# Patient Record
Sex: Female | Born: 1970 | Race: Asian | Hispanic: No | Marital: Married | Smoking: Never smoker
Health system: Southern US, Community
[De-identification: ages and names within clinical notes are randomized; demographics above are authoritative.]

---

## 2008-02-22 ENCOUNTER — Encounter (INDEPENDENT_AMBULATORY_CARE_PROVIDER_SITE_OTHER): Payer: Self-pay | Admitting: Obstetrics and Gynecology

## 2008-02-22 ENCOUNTER — Ambulatory Visit (HOSPITAL_COMMUNITY): Admission: RE | Admit: 2008-02-22 | Discharge: 2008-02-22 | Payer: Self-pay | Admitting: Obstetrics and Gynecology

## 2008-05-06 ENCOUNTER — Ambulatory Visit (HOSPITAL_COMMUNITY): Admission: RE | Admit: 2008-05-06 | Discharge: 2008-05-06 | Payer: Self-pay | Admitting: Obstetrics and Gynecology

## 2009-02-06 ENCOUNTER — Inpatient Hospital Stay (HOSPITAL_COMMUNITY): Admission: AD | Admit: 2009-02-06 | Discharge: 2009-02-08 | Payer: Self-pay | Admitting: Obstetrics and Gynecology

## 2010-08-09 IMAGING — RF DG HYSTEROGRAM
3 series · 3 of 3 positions shown · IV contrast (omnipaque)
Comparison: none

CLINICAL DATA: Infertility; history of three miscarriages

HYSTEROSALPINGOGRAM
TECHNIQUE: Following cleansing of the cervix and vagina with
Betadine solution, a hysterosalpingogram was performed using a 5-
French hysterosalpingogram catheter and Omnipaque 300 contrast. The
patient tolerated the examination without difficulty.

[Series 1: run · 1 of 1 slices shown (1 of 3)]
[im 1/1]
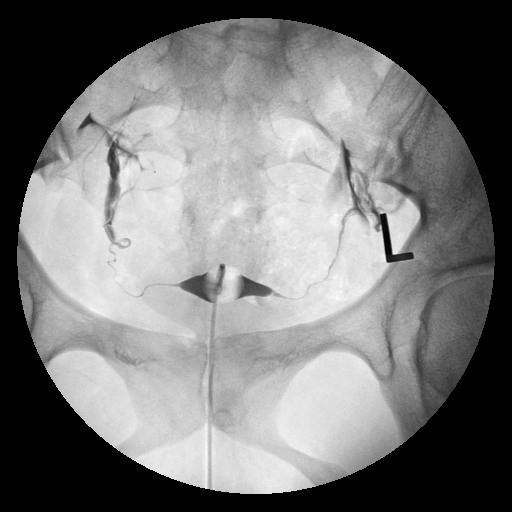

[Series 2: run · 1 of 1 slices shown (2 of 3)]
[im 1/1]
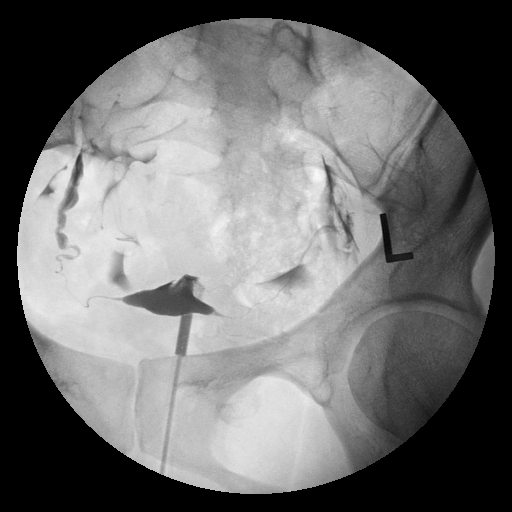

[Series 3: run · 1 of 1 slices shown (3 of 3)]
[im 1/1]
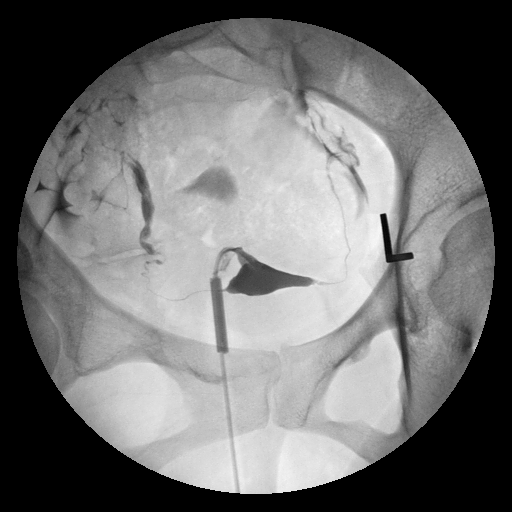

[3 of 3 positions shown; findings below may reference images not displayed]

FINDINGS: The uterine cavity has a normal appearance with no
evidence of fixed filling defects.  Both fallopian tubes promptly
fill and have a normal appearance.  There is free spill of contrast
into both adnexal regions.
IMPRESSION: Normal HSG.

## 2010-08-13 LAB — CBC
HCT: 29.4 % — ABNORMAL LOW (ref 36.0–46.0)
Hemoglobin: 9.9 g/dL — ABNORMAL LOW (ref 12.0–15.0)
MCV: 95.5 fL (ref 78.0–100.0)
Platelets: 249 10*3/uL (ref 150–400)
RBC: 3.08 MIL/uL — ABNORMAL LOW (ref 3.87–5.11)
WBC: 17.5 10*3/uL — ABNORMAL HIGH (ref 4.0–10.5)

## 2010-08-14 LAB — CBC
HCT: 36.6 % (ref 36.0–46.0)
Hemoglobin: 12.3 g/dL (ref 12.0–15.0)
MCV: 94.5 fL (ref 78.0–100.0)
RDW: 13.9 % (ref 11.5–15.5)

## 2010-09-22 NOTE — Op Note (Signed)
NAMEJIRAH, Betty [name]                    ACCOUNT NO.:  [id number]   MEDICAL RECORD NO.:  [id number]          PATIENT TYPE:  AMB   LOCATION:  SDC                           FACILITY:  [facility]   PHYSICIAN:  Lenoard Aden, M.D.DATE OF BIRTH:  [birth year]   DATE OF PROCEDURE:  DATE OF DISCHARGE:                               OPERATIVE REPORT   PREOPERATIVE DIAGNOSES:  1. Missed abortion.  2. 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   POSTOPERATIVE DIAGNOSES:  1. Missed abortion.  2. 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   PROCEDURE:  Suction dilatation and evacuation.   SURGEON:  Lenoard Aden, MD   ANESTHESIA:  MAC paracervical.   ESTIMATED BLOOD LOSS:  Less than 50 mL.   COMPLICATIONS:  None.   DRAINS:  None.   COUNTS:  Correct.   The patient recovering in good condition.   Tissue for pathology and for chromosomal analysis.   BRIEF OPERATIVE NOTE:  After being appraised the risks of anesthesia,  infection, bleeding, injury to abdominal organs, need for repair,  delayed versus immediate complications include bowel and bladder injury  with uterine perforation, possible need for repair.  The patient was  brought to the operating room.  She was administered IV sedation without  difficulty, prepped and draped in usual sterile fashion and catheterized  till the bladder was empty.  Paracervical block placed in standard  fashion using a dilute Xylocaine solution.  Cervix was easily dilated up  to a #25 Pratt dilator and 8-mm suction curette placed.  Products of  conception aspirated and noted.  Repeat suction and  curettage in a four-quadrant method reveals the cavity to be empty.  Good hemostasis was noted.  Tissue collected for chromosomal analysis.  All instruments were removed from the vagina.  The patient is awakened  and transferred to recovery in good condition.      Lenoard Aden, M.D.  Electronically  Signed     RJT/MEDQ  D:  02/22/2008  T:  02/23/2008  Job:  [id number]

## 2010-09-26 ENCOUNTER — Inpatient Hospital Stay (HOSPITAL_COMMUNITY)
Admission: EM | Admit: 2010-09-26 | Discharge: 2010-09-28 | DRG: 373 | Disposition: A | Discharge status: Home / Self Care | Payer: BC Managed Care – PPO | Source: Ambulatory Visit | Attending: Obstetrics & Gynecology | Admitting: Obstetrics & Gynecology

## 2010-09-26 DIAGNOSIS — O09529 Supervision of elderly multigravida, unspecified trimester: Secondary | ICD-10-CM | POA: Diagnosis present

## 2010-09-27 LAB — RPR: RPR Ser Ql: NONREACTIVE

## 2010-09-27 LAB — CBC
MCH: 30.6 pg (ref 26.0–34.0)
MCHC: 33.6 g/dL (ref 30.0–36.0)
MCV: 90.9 fL (ref 78.0–100.0)
Platelets: 256 10*3/uL (ref 150–400)
RBC: 3.86 MIL/uL — ABNORMAL LOW (ref 3.87–5.11)

## 2010-09-29 ENCOUNTER — Inpatient Hospital Stay (HOSPITAL_COMMUNITY): Admission: AD | Admit: 2010-09-29 | Payer: Self-pay | Source: Ambulatory Visit | Admitting: Obstetrics and Gynecology

## 2010-10-09 NOTE — H&P (Signed)
  [name], [name]                    ACCOUNT NO.:  [id number]  MEDICAL RECORD NO.:  [id number]        PATIENT TYPE:  WINP  LOCATION:  106                           FACILITY:  [facility]  PHYSICIAN:  Lenoard Aden, M.D.DATE OF BIRTH:  18-Jan-1971  DATE OF ADMISSION:  09/26/2010 DATE OF DISCHARGE:                             HISTORY & PHYSICAL   CHIEF COMPLAINT:  History of advanced dilatation, precipitous labor for induction.  HISTORY OF PRESENT ILLNESS:  A 40 year old Asian female, G5, P1, with a history of advanced dilatation and history of precipitous labor with previous vaginal delivery who presents for controlled effort at induction.  ALLERGIES:  She has no known drug allergies, no known LATEX allergies.  MEDICATIONS:  Prenatal vitamins.  FAMILY HISTORY:  Diabetes, heart disease, respiratory failure, hypertension, with the history of vaginal delivery x1, miscarriage x4, with D and C x2.  PHYSICAL EXAMINATION:  GENERAL:  She is a well-developed, well-nourished Asian female, in no acute distress. HEENT:  Normal. LUNGS:  Clear. HEART:  Regular rate and rhythm. ABDOMEN:  Soft, gravid, nontender.  Estimated fetal weight 7-1/2 pounds. Cervix is 4, 70% vertex, -1. EXTREMITIES:  There are no cords. NEUROLOGIC:  Nonfocal. SKIN:  Intact.  IMPRESSION:  Term intrauterine pregnancy has 39+ weeks with advanced dilatation, history of precipitous labor, for controlled trial of induction.  PLAN:  Proceed with induction.     Lenoard Aden, M.D.     RJT/MEDQ  D:  09/27/2010  T:  09/28/2010  Job:  [id number]  Electronically Signed by Olivia Mackie M.D. on 10/09/2010 02:00:56 PM

## 2011-02-09 LAB — CBC
HCT: 39.9
Hemoglobin: 13.3
MCHC: 33.4
Platelets: 282
RDW: 12.7

## 2011-02-09 LAB — TISSUE HYBRIDIZATION TO NCBH

## 2012-06-15 ENCOUNTER — Encounter: Payer: Self-pay | Admitting: Family Medicine

## 2012-06-15 ENCOUNTER — Ambulatory Visit (INDEPENDENT_AMBULATORY_CARE_PROVIDER_SITE_OTHER): Payer: BC Managed Care – PPO | Admitting: Family Medicine

## 2012-06-15 VITALS — BP 100/70 | HR 78 | Temp 98.1°F | Ht 65.75 in | Wt 139.0 lb

## 2012-06-15 DIAGNOSIS — Z7189 Other specified counseling: Secondary | ICD-10-CM

## 2012-06-15 DIAGNOSIS — R7989 Other specified abnormal findings of blood chemistry: Secondary | ICD-10-CM

## 2012-06-15 DIAGNOSIS — Z7689 Persons encountering health services in other specified circumstances: Secondary | ICD-10-CM

## 2012-06-15 LAB — LIPID PANEL
LDL Cholesterol: 107 mg/dL — ABNORMAL HIGH (ref 0–99)
VLDL: 16 mg/dL (ref 0.0–40.0)

## 2012-06-15 LAB — HEMOGLOBIN A1C: Hgb A1c MFr Bld: 5.8 % (ref 4.6–6.5)

## 2012-06-15 NOTE — Patient Instructions (Addendum)
-  We have ordered labs or studies at this visit. It can take up to 1-2 weeks for results and processing. We will contact you with instructions IF your results are abnormal. Normal results will be released to your MYCHART. If you have not heard from us or can not find your results in MYCHART in 2 weeks please contact our office.  -PLEASE SIGN UP FOR MYCHART TODAY   We recommend the following healthy lifestyle measures: - eat a healthy diet consisting of lots of vegetables, fruits, beans, nuts, seeds, healthy meats such as white chicken and fish and whole grains.  - avoid fried foods, fast food, processed foods, sodas, red meet and other fattening foods.  - get a least 150 minutes of aerobic exercise per week.   Follow up in: 1 year  

## 2012-06-15 NOTE — Progress Notes (Signed)
Chief Complaint  Patient presents with  . Establish Care    HPI:  Betty [name] is here to establish care.  Last PCP and physical: Sees Dr. Meredith Leeds at [facility] - had physical in July, pap normal, no labs.  Has the following chronic problems and concerns today:  There is no problem list on file for this patient.   Health Maintenance: -utd on all health maintenance except lipid and diabetes screening  ROS: See pertinent positives and negatives per HPI.  History reviewed. No pertinent past medical history.  Family History  Problem Relation Age of Onset  . Hypertension Mother   . Cancer Paternal Aunt     breast ca  . Diabetes Maternal Grandmother   . Heart disease Maternal Grandmother   . Diabetes Maternal Grandfather     History   Social History  . Marital Status: Married    Spouse Name: N/A    Number of Children: N/A  . Years of Education: N/A   Social History Main Topics  . Smoking status: Never Smoker   . Smokeless tobacco: None  . Alcohol Use: Yes     Comment: occ - 1 glass of wine a week  . Drug Use: No  . Sexually Active: Yes   Other Topics Concern  . None   Social History Narrative   Work or School: Therapist, music at [employer] Situation: lives with husband and 2 childrenSpiritual Beliefs: christian, liberalLifestyle: pilates about 4x per week, healthy well rounded diet - no sweets    Current outpatient prescriptions:Cholecalciferol (VITAMIN D) 2000 UNITS CAPS, Take by mouth., Disp: , Rfl: ;  Magnesium 300 MG CAPS, Take by mouth., Disp: , Rfl:   EXAM:  Filed Vitals:   06/15/12 1443  BP: 100/70  Pulse: 78  Temp: 98.1 F (36.7 C)    Body mass index is 22.61 kg/(m^2).  GENERAL: vitals reviewed and listed above, alert, oriented, appears well hydrated and in no acute distress  HEENT: atraumatic, conjunttiva clear, no obvious abnormalities on inspection of external nose and ears  NECK: no obvious masses on inspection  LUNGS: clear to  auscultation bilaterally, no wheezes, rales or rhonchi, good air movement  CV: HRRR, no peripheral edema  MS: moves all extremities without noticeable abnormality  PSYCH: pleasant and cooperative, no obvious depression or anxiety  ASSESSMENT AND PLAN:  Discussed the following assessment and plan:  1. Establishing care with new doctor, encounter for  Lipid Panel, Hemoglobin A1c    -NON-FASTING LABS -We reviewed the PMH, PSH, FH, SH, Meds and Allergies. -follow up in 1 year and prn   -Patient advised to return or notify a doctor immediately if symptoms worsen or persist or new concerns arise.  Patient Instructions  -We have ordered labs or studies at this visit. It can take up to 1-2 weeks for results and processing. We will contact you with instructions IF your results are abnormal. Normal results will be released to your [facility]. If you have not heard from [place] or can not find your results in [facility] in 2 weeks please contact our office.  -PLEASE SIGN UP FOR MYCHART TODAY   We recommend the following healthy lifestyle measures: - eat a healthy diet consisting of lots of vegetables, fruits, beans, nuts, seeds, healthy meats such as white chicken and fish and whole grains.  - avoid fried foods, fast food, processed foods, sodas, red meet and other fattening foods.  - get a least 150 minutes of aerobic exercise per week.  Follow up in:      Kriste Basque R.

## 2012-06-16 ENCOUNTER — Telehealth: Payer: Self-pay | Admitting: Family Medicine

## 2012-06-16 NOTE — Telephone Encounter (Signed)
Labs look good. Diabetes labs is mildly high indicating a risk for developing diabetes in the future. A healthy diet and regular cardiovascular exercise can help prevent this.

## 2012-06-19 NOTE — Telephone Encounter (Signed)
Attempted to call pt; pt's VM has not been set up at this time.  Will attempt to call at a later time.

## 2012-06-26 NOTE — Telephone Encounter (Signed)
Left a message for pt to return call 

## 2013-03-15 ENCOUNTER — Other Ambulatory Visit: Payer: Self-pay

## 2017-01-28 ENCOUNTER — Encounter: Payer: Self-pay | Admitting: Internal Medicine

## 2019-01-02 ENCOUNTER — Other Ambulatory Visit: Payer: Self-pay

## 2019-01-02 DIAGNOSIS — Z20822 Contact with and (suspected) exposure to covid-19: Secondary | ICD-10-CM

## 2019-01-03 LAB — NOVEL CORONAVIRUS, NAA: SARS-CoV-2, NAA: NOT DETECTED

## 2021-07-21 ENCOUNTER — Telehealth: Payer: Self-pay

## 2021-07-21 NOTE — Telephone Encounter (Signed)
Pt last OV with Dr Maudie Mercury in 2014. [facility] is listed as PCP however there have been no documented visits with Panosh.   ?LVM to call office to give more information & schedule appt if needed (will have to ask Dr Regis Bill). ?

## 2021-07-24 NOTE — Telephone Encounter (Signed)
Pt is calling back and she in not trying to get est with dr Regis Bill ?

## 2023-02-21 ENCOUNTER — Other Ambulatory Visit (HOSPITAL_BASED_OUTPATIENT_CLINIC_OR_DEPARTMENT_OTHER): Payer: Self-pay | Admitting: Family Medicine

## 2023-02-21 DIAGNOSIS — E785 Hyperlipidemia, unspecified: Secondary | ICD-10-CM

## 2023-04-05 ENCOUNTER — Ambulatory Visit (HOSPITAL_BASED_OUTPATIENT_CLINIC_OR_DEPARTMENT_OTHER)
Admission: RE | Admit: 2023-04-05 | Discharge: 2023-04-05 | Disposition: A | Discharge status: Home / Self Care | Payer: Self-pay | Source: Ambulatory Visit | Attending: Family Medicine | Admitting: Family Medicine

## 2023-04-05 DIAGNOSIS — E785 Hyperlipidemia, unspecified: Secondary | ICD-10-CM | POA: Insufficient documentation
